# Patient Record
Sex: Male | Born: 1975 | Race: White | Hispanic: No | Smoking: Current every day smoker
Health system: Southern US, Community
[De-identification: ages and names within clinical notes are randomized; demographics above are authoritative.]

---

## 2018-05-26 ENCOUNTER — Encounter (HOSPITAL_COMMUNITY): Payer: Self-pay | Admitting: Emergency Medicine

## 2018-05-26 ENCOUNTER — Emergency Department (HOSPITAL_COMMUNITY)
Admission: EM | Admit: 2018-05-26 | Discharge: 2018-05-26 | Disposition: A | Discharge status: Home / Self Care | Payer: Self-pay | Attending: Emergency Medicine | Admitting: Emergency Medicine

## 2018-05-26 ENCOUNTER — Other Ambulatory Visit: Payer: Self-pay

## 2018-05-26 DIAGNOSIS — F17200 Nicotine dependence, unspecified, uncomplicated: Secondary | ICD-10-CM | POA: Insufficient documentation

## 2018-05-26 DIAGNOSIS — K029 Dental caries, unspecified: Secondary | ICD-10-CM | POA: Insufficient documentation

## 2018-05-26 MED ORDER — CLINDAMYCIN HCL 300 MG PO CAPS
300.0000 mg | ORAL_CAPSULE | Freq: Once | ORAL | Status: AC
  Administered 2018-05-26: 300 mg via ORAL
  Filled 2018-05-26: qty 1

## 2018-05-26 MED ORDER — OXYCODONE-ACETAMINOPHEN 5-325 MG PO TABS
2.0000 | ORAL_TABLET | Freq: Once | ORAL | Status: AC
  Administered 2018-05-26: 2 via ORAL
  Filled 2018-05-26: qty 2

## 2018-05-26 MED ORDER — CLINDAMYCIN HCL 300 MG PO CAPS: 300.0000 mg | ORAL_CAPSULE | Freq: Four times a day (QID) | ORAL | 0 refills | Status: AC

## 2018-05-26 MED ORDER — OXYCODONE-ACETAMINOPHEN 5-325 MG PO TABS: 1.0000 | ORAL_TABLET | Freq: Four times a day (QID) | ORAL | 0 refills | Status: AC | PRN

## 2018-05-26 NOTE — ED Provider Notes (Signed)
[facility]-EMERGENCY DEPT Provider Note   CSN: [id number] Arrival date & time: 05/26/18  0210    History   Chief Complaint Chief Complaint  Patient presents with  . Dental Pain    HPI Scott Barnes is a 43 y.o. male.     Patient is a 43 year old male with no significant past medical history.  He presents with complaints of severe dental pain.  This is worsened over the past several days.  Patient has recently relocated here from [place] and has no dentist or primary doctor.  He denies any fevers or chills.  He denies any difficulties breathing or swallowing.  The history is provided by the patient.  Dental Pain  Location:  Lower Lower teeth location:  31/RL 2nd molar, 32/RL 3rd molar and 30/RL 1st molar Quality:  Throbbing Severity:  Severe Onset quality:  Gradual Duration:  3 days Timing:  Constant Progression:  Worsening Chronicity:  New Relieved by:  Nothing Worsened by:  Nothing Ineffective treatments:  NSAIDs Associated symptoms: no fever     History reviewed. No pertinent past medical history.  There are no active problems to display for this patient.   History reviewed. No pertinent surgical history.      Home Medications    Prior to Admission medications   Medication Sig Start Date End Date Taking? Authorizing Provider  clindamycin (CLEOCIN) 300 MG capsule Take 1 capsule (300 mg total) by mouth 4 (four) times daily. X 7 days 05/26/18   Geoffery Lyonselo, Sura Canul, MD  oxyCODONE-acetaminophen (PERCOCET) 5-325 MG tablet Take 1-2 tablets by mouth every 6 (six) hours as needed. 05/26/18   Geoffery Lyonselo, Jaziya Obarr, MD    Family History History reviewed. No pertinent family history.  Social History Social History   Tobacco Use  . Smoking status: Current Every Day Smoker  . Smokeless tobacco: Never Used  Substance Use Topics  . Alcohol use: Yes  . Drug use: Never     Allergies   Patient has no known allergies.   Review of Systems Review of  Systems  Constitutional: Negative for fever.  All other systems reviewed and are negative.    Physical Exam Updated Vital Signs BP (!) 149/89 (BP Location: Left Arm)   Pulse (!) 105   Temp 97.9 F (36.6 C) (Oral)   Resp 20   Ht 5\' 11"  (1.803 m)   Wt 70.3 kg   SpO2 100%   BMI 21.62 kg/m   Physical Exam Vitals signs and nursing note reviewed.  Constitutional:      General: He is not in acute distress.    Appearance: Normal appearance. He is not ill-appearing.  HENT:     Head: Normocephalic and atraumatic.     Mouth/Throat:     Mouth: Mucous membranes are moist.     Comments: Patient has multiple heavily decayed teeth throughout.  There is gingival inflammation, however no obvious abscess.  There is tenderness to the right lower molars.  There is no stridor.  There is no crepitus or swelling of the submental space. Pulmonary:     Effort: Pulmonary effort is normal.     Breath sounds: No stridor.  Skin:    General: Skin is warm and dry.  Neurological:     Mental Status: He is alert.      ED Treatments / Results  Labs (all labs ordered are listed, but only abnormal results are displayed) Labs Reviewed - No data to display  EKG None  Radiology No results found.  Procedures Procedures (including critical care time)  Medications Ordered in ED Medications  clindamycin (CLEOCIN) capsule 300 mg (has no administration in time range)  oxyCODONE-acetaminophen (PERCOCET/ROXICET) 5-325 MG per tablet 2 tablet (has no administration in time range)     Initial Impression / Assessment and Plan / ED Course  I have reviewed the triage vital signs and the nursing notes.  Pertinent labs & imaging results that were available during my care of the patient were reviewed by me and considered in my medical decision making (see chart for details).  Patient will be given antibiotics and pain medicine.  He is to return as needed for any problems.  He will also be provided with the  dental resource guide.  Final Clinical Impressions(s) / ED Diagnoses   Final diagnoses:  Pain due to dental caries    ED Discharge Orders         Ordered    clindamycin (CLEOCIN) 300 MG capsule  4 times daily     05/26/18 0232    oxyCODONE-acetaminophen (PERCOCET) 5-325 MG tablet  Every 6 hours PRN     05/26/18 0232           Geoffery Lyons, MD 05/26/18 0236

## 2018-05-26 NOTE — ED Notes (Signed)
Discharge instructions and medications discussed with patient and reviewed dentist resource guide with patient. Patient verbalizes understanding and has no questions at this time.

## 2018-05-26 NOTE — ED Triage Notes (Signed)
Right lower jaw pain onset x2 days

## 2018-05-26 NOTE — Discharge Instructions (Signed)
Clindamycin as prescribed.  Percocet as prescribed as needed for pain.  Follow-up with a dentist in the next few days.  The dental resource guide has been provided in this discharge summary to assist you with locating a dentist.

## 2021-02-02 ENCOUNTER — Other Ambulatory Visit: Payer: Self-pay | Admitting: Occupational Medicine

## 2021-02-02 ENCOUNTER — Other Ambulatory Visit: Payer: Self-pay

## 2021-02-02 ENCOUNTER — Ambulatory Visit: Payer: Self-pay

## 2021-02-02 DIAGNOSIS — Z0000 Encounter for general adult medical examination without abnormal findings: Secondary | ICD-10-CM

## 2022-12-06 ENCOUNTER — Other Ambulatory Visit: Payer: Self-pay | Admitting: Urology

## 2022-12-22 ENCOUNTER — Ambulatory Visit (HOSPITAL_BASED_OUTPATIENT_CLINIC_OR_DEPARTMENT_OTHER): Admit: 2022-12-22 | Payer: Self-pay | Admitting: Urology

## 2022-12-22 ENCOUNTER — Encounter (HOSPITAL_BASED_OUTPATIENT_CLINIC_OR_DEPARTMENT_OTHER): Payer: Self-pay

## 2022-12-22 SURGERY — CYSTOSCOPY/URETEROSCOPY/HOLMIUM LASER/STENT PLACEMENT
Anesthesia: General | Laterality: Bilateral

## 2023-03-02 IMAGING — DX DG CHEST 1V
2 series · 2 of 2 positions shown · non-contrast
Comparison: None.

CLINICAL DATA: Physical exam.  History of tobacco use.

EXAM:
CHEST  1 VIEW

[chest pa (1 of 2)]
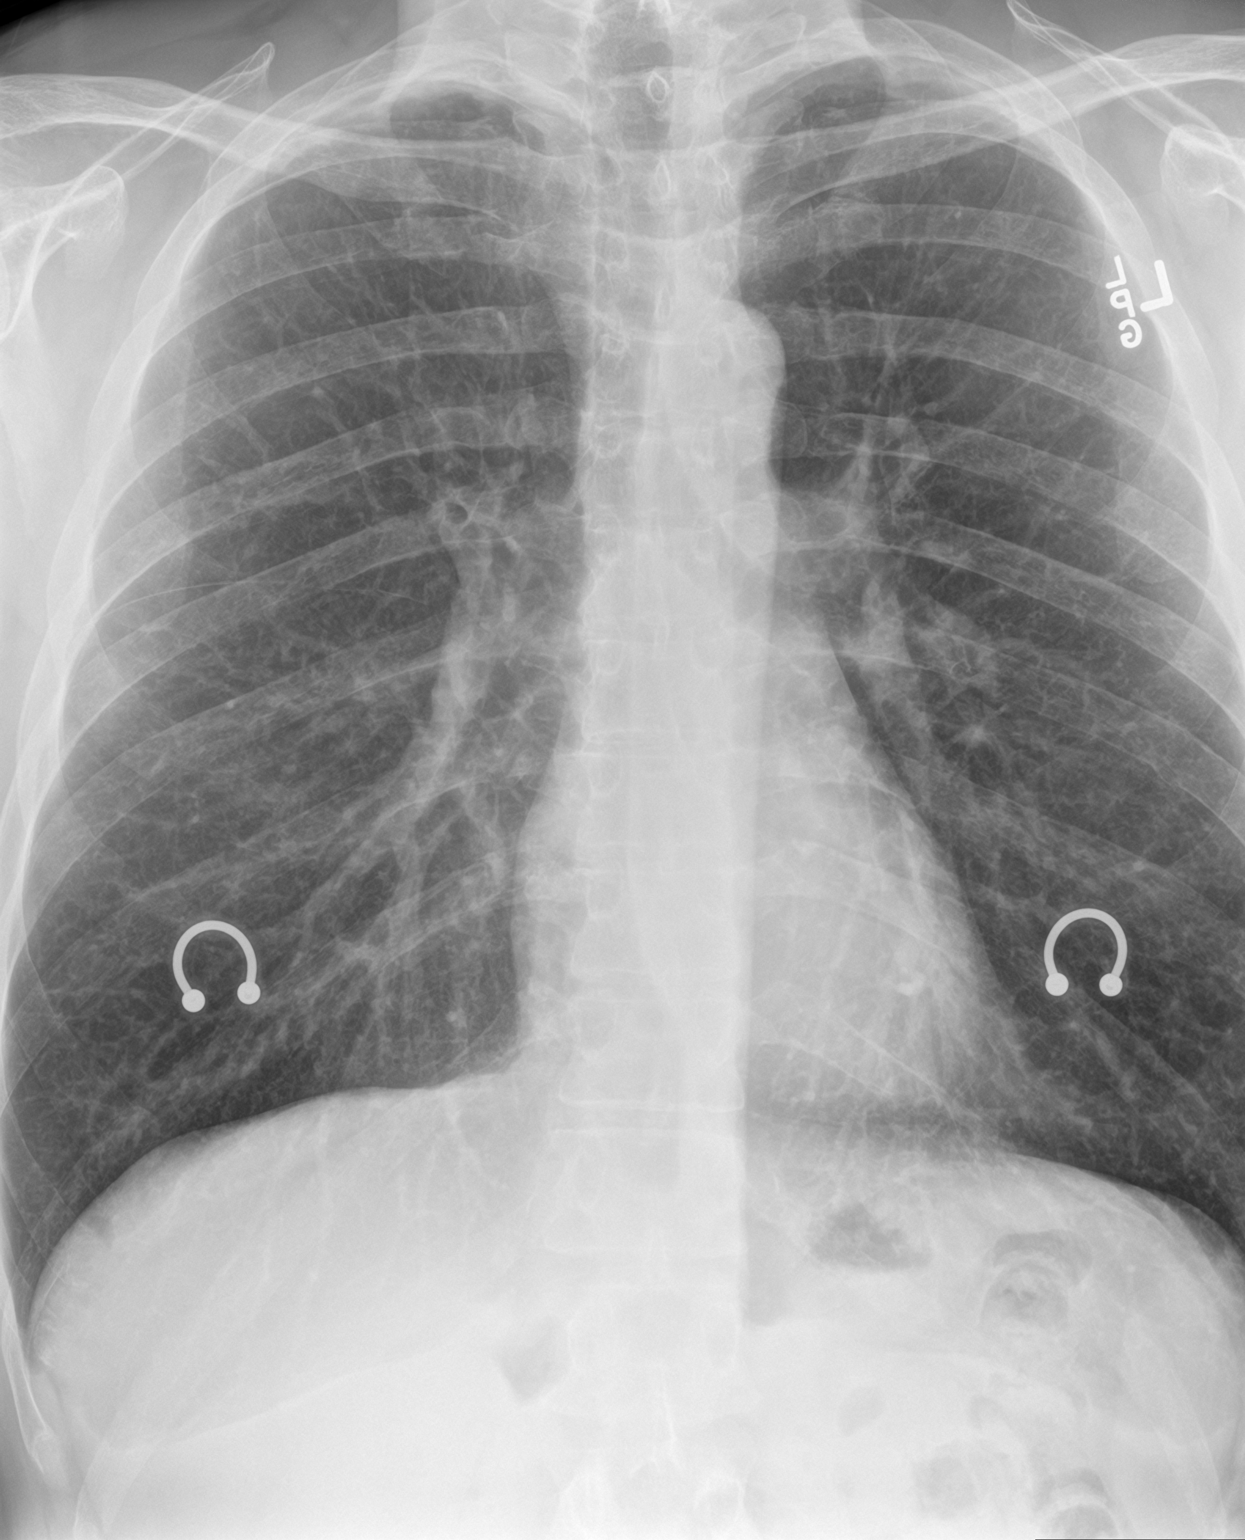

[chest pa (2 of 2)]
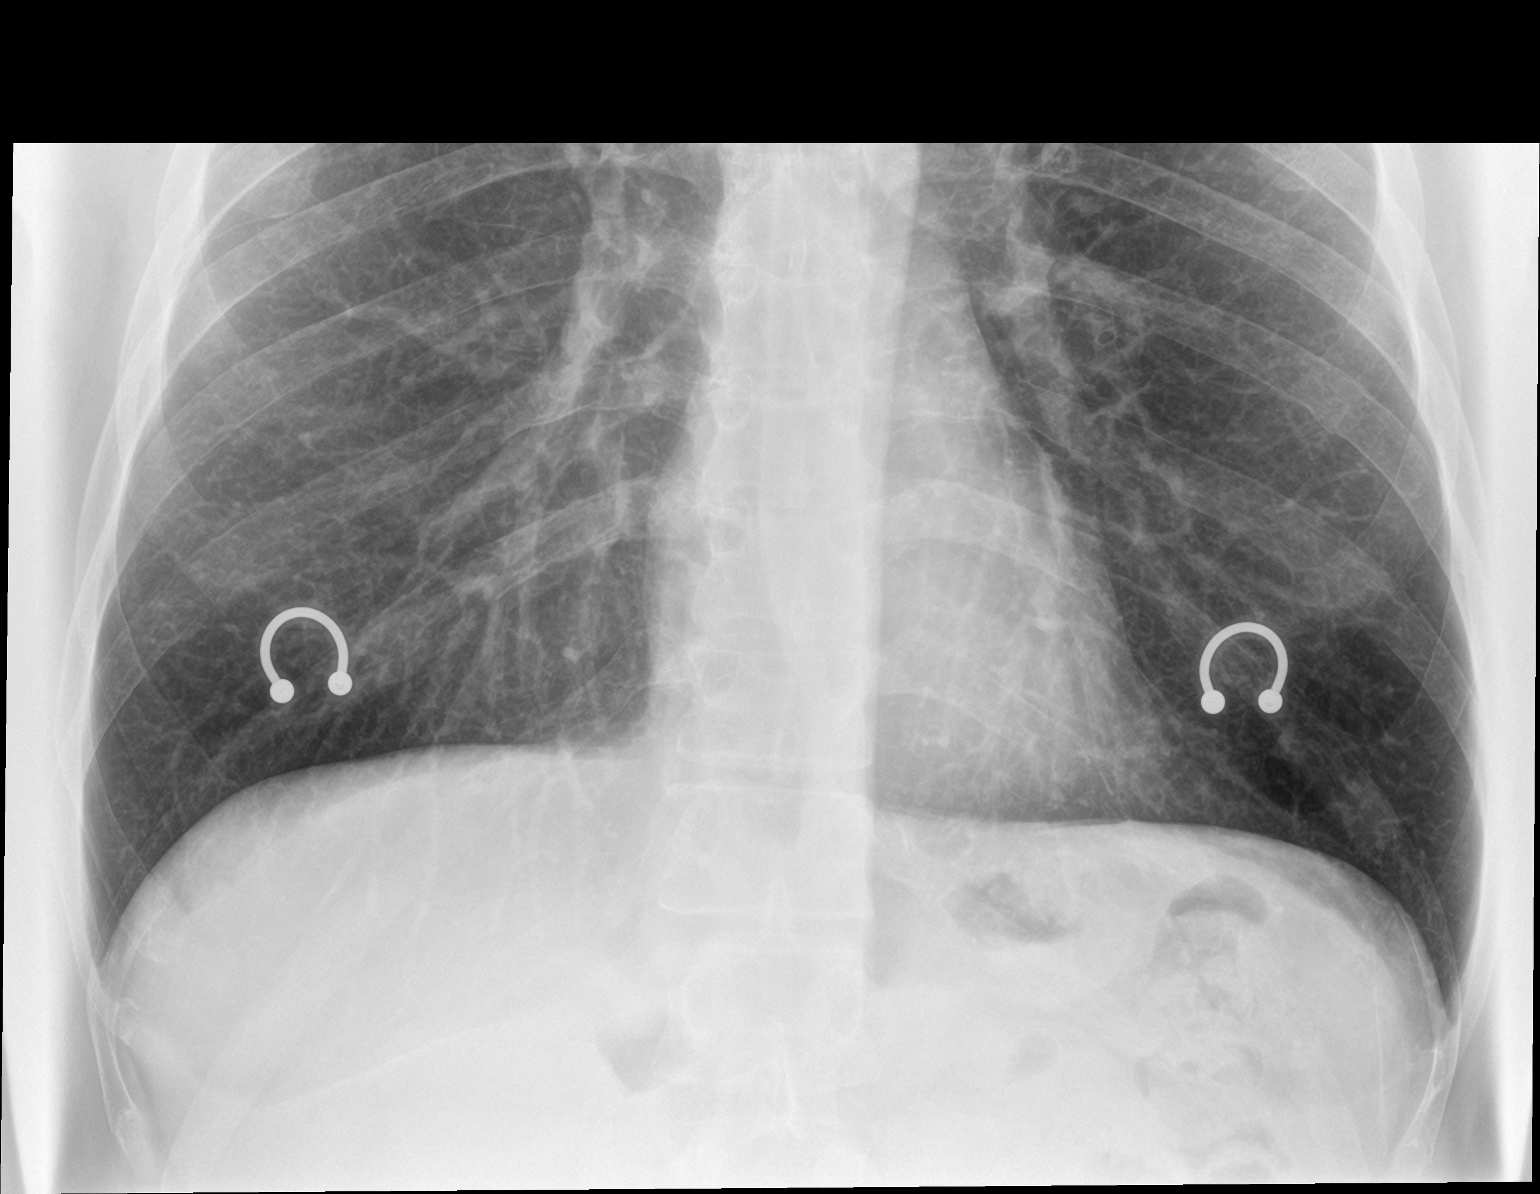

[2 of 2 positions shown; findings below may reference images not displayed]

FINDINGS: The heart size and mediastinal contours are within normal limits.
Both lungs are clear. The visualized skeletal structures are
unremarkable.
IMPRESSION: No active disease.
